# Patient Record
Sex: Male | Born: 2000 | Race: White | Hispanic: No | Marital: Single | Smoking: Current every day smoker
Health system: Southern US, Community
[De-identification: ages and names within clinical notes are randomized; demographics above are authoritative.]

---
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
## 2019-11-19 IMAGING — US US SCROTUM W/ DOPPLER COMPLETE
1 series · 14 of 25 positions shown · non-contrast
Comparison: None.

CLINICAL DATA: Right testicular pain for 1 day

EXAM:
SCROTAL ULTRASOUND
DOPPLER ULTRASOUND OF THE TESTICLES
TECHNIQUE: Complete ultrasound examination of the testicles, epididymis, and
other scrotal structures was performed. Color and spectral Doppler
ultrasound were also utilized to evaluate blood flow to the
testicles.

[Series 1: us scrotum w/ doppler complete · 14 of 51 slices shown]
[im 1/51]
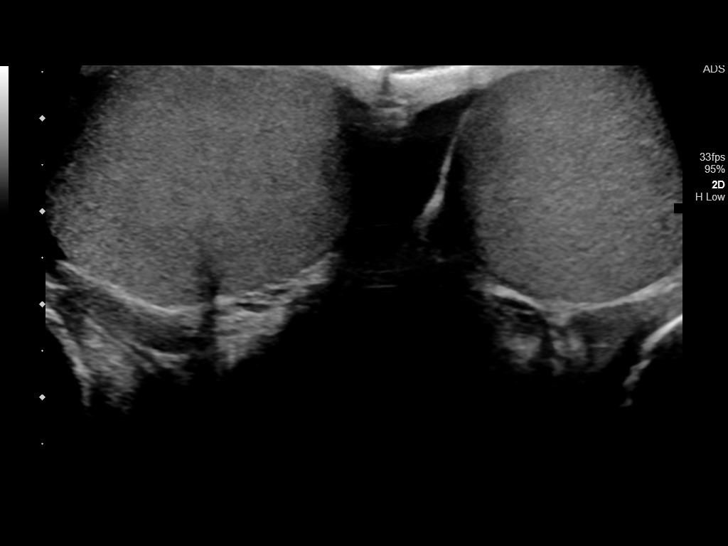
[im 5/51]
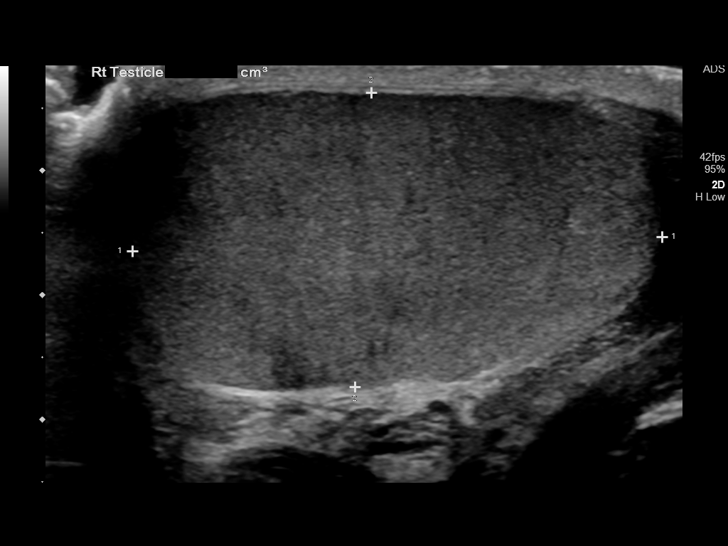
[im 9/51]
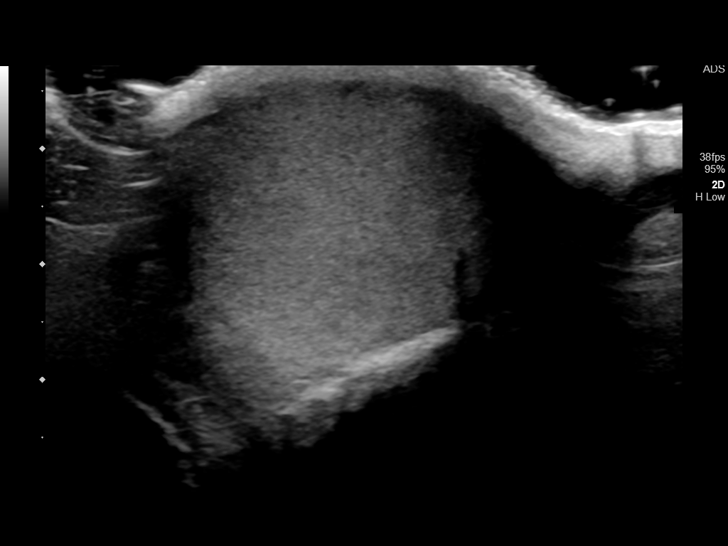
[im 13/51]
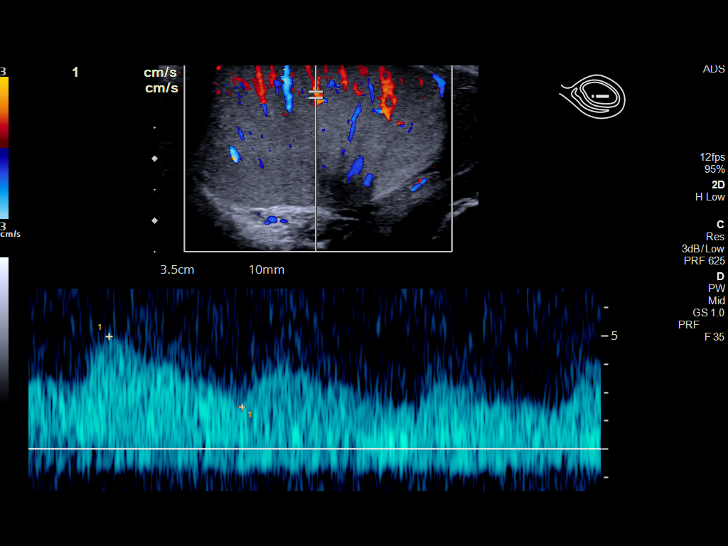
[im 17/51]
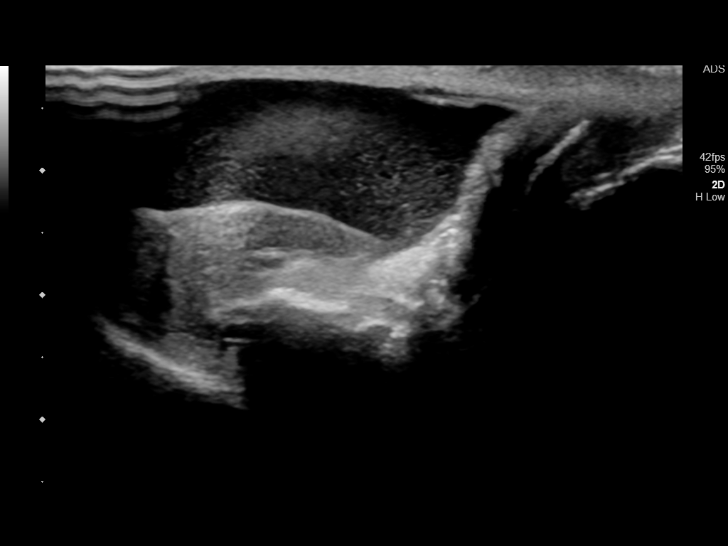
[im 19/51]
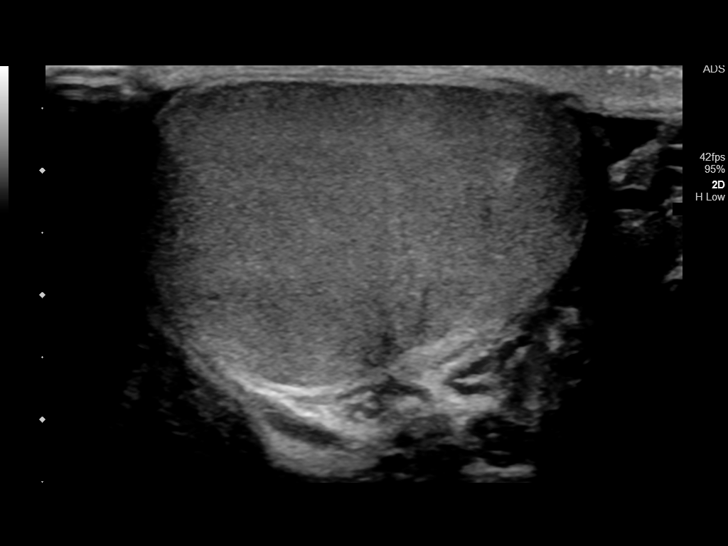
[im 23/51]
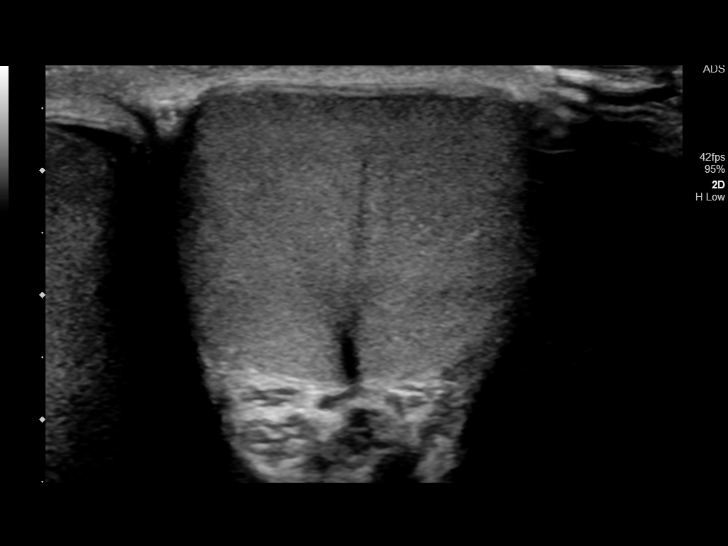
[im 28/51]
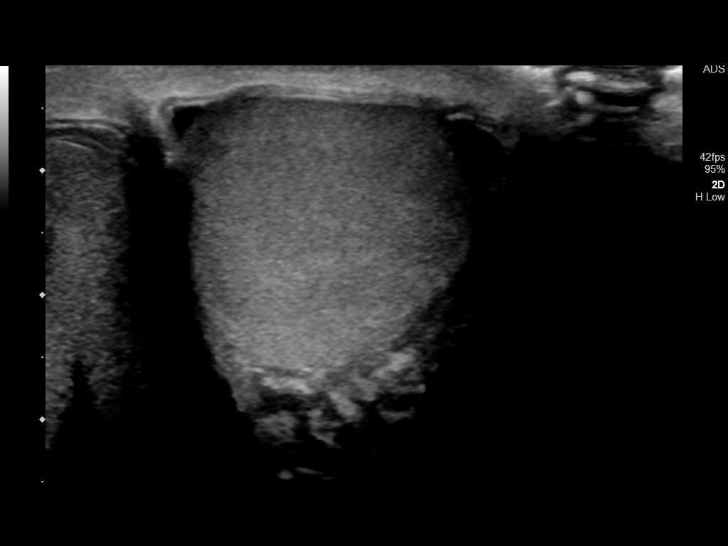
[im 32/51]
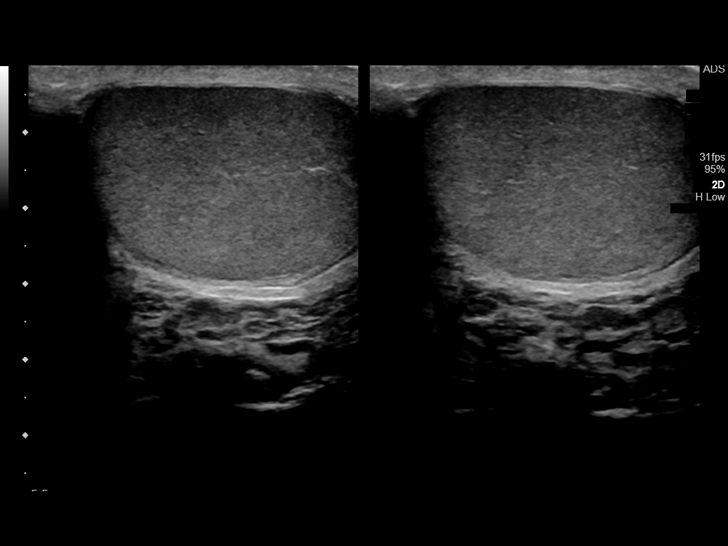
[im 34/51]
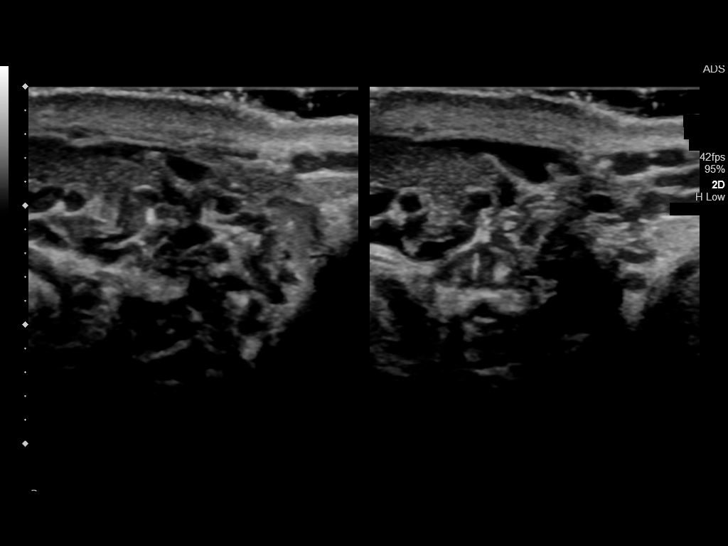
[im 38/51]
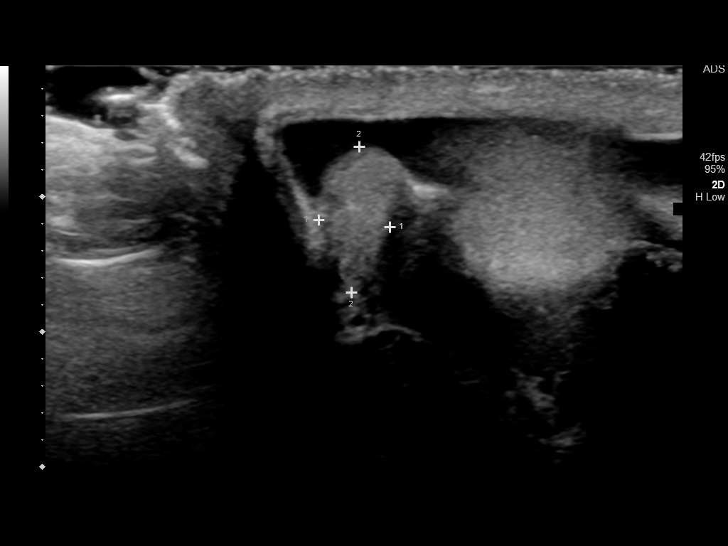
[im 42/51]
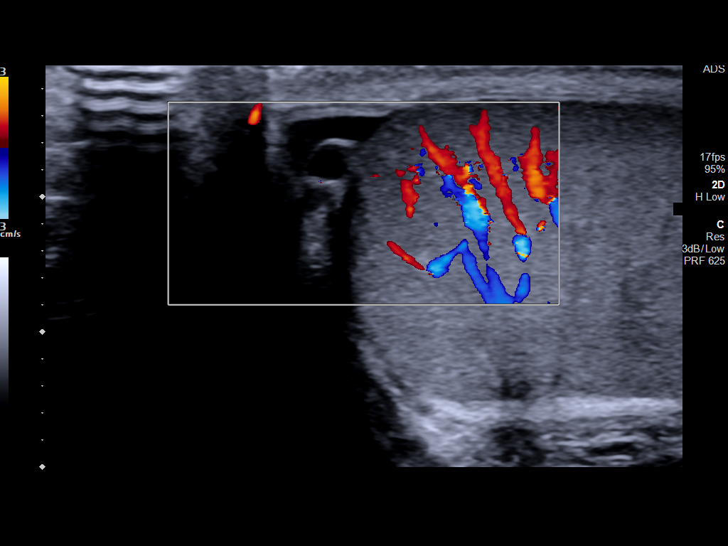
[im 46/51]
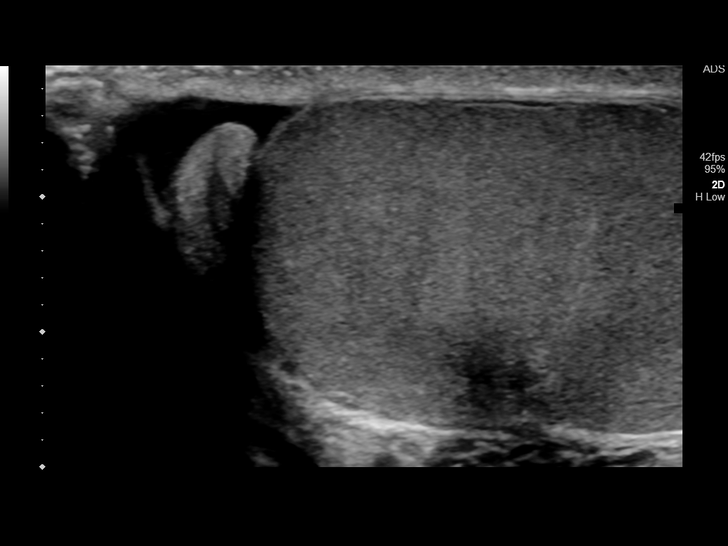
[im 51/51]
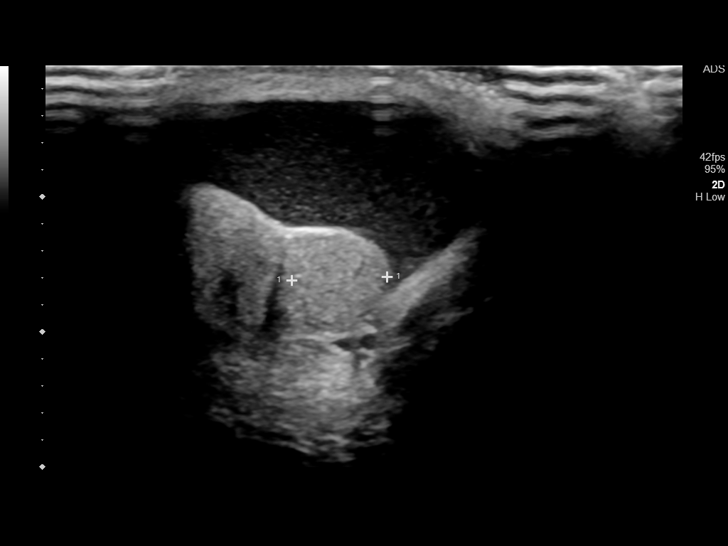

[14 of 25 positions shown; findings below may reference images not displayed]

FINDINGS: Right testicle

Measurements: 4.2 x 2.4 x 3.6 cm. No mass or microlithiasis
visualized.

Left testicle

Measurements: 4.2 x 2.5 x 2.9 cm. No mass or microlithiasis
visualized.

Right epididymis: Normal in size. 3 x 3 x 4 mm right epididymal
cyst.

Left epididymis:  Normal in size and appearance.

Hydrocele:  Trace left hydrocele.

Varicocele:  None visualized.

Pulsed Doppler interrogation of both testes demonstrates normal low
resistance arterial and venous waveforms bilaterally.
IMPRESSION: 1. No acute testicular abnormality. No testicular torsion. No
testicular mass.

## 2019-12-25 ENCOUNTER — Ambulatory Visit: Payer: Managed Care, Other (non HMO) | Attending: Internal Medicine

## 2019-12-25 DIAGNOSIS — Z23 Encounter for immunization: Secondary | ICD-10-CM

## 2019-12-25 NOTE — Progress Notes (Signed)
   Covid-19 Vaccination Clinic  Name:  Alecxander Mainwaring    MRN: [id number] DOB: 01-21-2001  12/25/2019  Mr. Bonet was observed post Covid-19 immunization for 15 minutes without incident. He was provided with Vaccine Information Sheet and instruction to access the V-Safe system.   Mr. Schweigert was instructed to call 911 with any severe reactions post vaccine: Marland Kitchen Difficulty breathing  . Swelling of face and throat  . A fast heartbeat  . A bad rash all over body  . Dizziness and weakness   Immunizations Administered    Name Date Dose VIS Date Route   Moderna COVID-19 Vaccine 12/25/2019  1:21 PM 0.5 mL 09/09/2019 Intramuscular   Manufacturer: Moderna   Lot: [id number]   NDC: 28118-867-73

## 2020-01-22 ENCOUNTER — Telehealth: Payer: Self-pay

## 2020-01-22 NOTE — Telephone Encounter (Signed)
Patient called stating that he just filled out questionnaire for his 2nd vaccine.  He was exposed to COVID-19 April 9th and has been in quarantine. He has no symptoms. Patient informed that he can have his 2nd vaccine as long as he has completed 14 days of quarantine and has no stymptoms. He states his appointment is the 4/20.  He verbalized understanding of all information.

## 2020-01-27 ENCOUNTER — Ambulatory Visit: Payer: Managed Care, Other (non HMO) | Attending: Family

## 2020-01-27 DIAGNOSIS — Z23 Encounter for immunization: Secondary | ICD-10-CM

## 2020-01-27 NOTE — Progress Notes (Signed)
   Covid-19 Vaccination Clinic  Name:  Luis Hall    MRN: [id number] DOB: 2001-03-02  01/27/2020  Mr. [name] was observed post Covid-19 immunization for 15 minutes without incident. He was provided with Vaccine Information Sheet and instruction to access the V-Safe system.   Mr. [name] was instructed to call 911 with any severe reactions post vaccine: Marland Kitchen Difficulty breathing  . Swelling of face and throat  . A fast heartbeat  . A bad rash all over body  . Dizziness and weakness   Immunizations Administered    Name Date Dose VIS Date Route   Moderna COVID-19 Vaccine 01/27/2020 11:43 AM 0.5 mL 09/2019 Intramuscular   Manufacturer: Moderna   Lot: [id number]   NDC: 11464-314-27

## 2020-07-18 ENCOUNTER — Emergency Department
Admission: EM | Admit: 2020-07-18 | Discharge: 2020-07-18 | Disposition: A | Discharge status: Home / Self Care | Payer: Managed Care, Other (non HMO) | Attending: Emergency Medicine | Admitting: Emergency Medicine

## 2020-07-18 ENCOUNTER — Encounter: Payer: Self-pay | Admitting: Emergency Medicine

## 2020-07-18 ENCOUNTER — Other Ambulatory Visit: Payer: Self-pay

## 2020-07-18 DIAGNOSIS — F17200 Nicotine dependence, unspecified, uncomplicated: Secondary | ICD-10-CM | POA: Diagnosis not present

## 2020-07-18 DIAGNOSIS — S0101XA Laceration without foreign body of scalp, initial encounter: Secondary | ICD-10-CM | POA: Insufficient documentation

## 2020-07-18 DIAGNOSIS — W01198A Fall on same level from slipping, tripping and stumbling with subsequent striking against other object, initial encounter: Secondary | ICD-10-CM | POA: Diagnosis not present

## 2020-07-18 NOTE — ED Triage Notes (Signed)
Pt arrives ambulatory to triage with laceration on scalp due to a fall about 10 hours ago. Pt has bleeding under control without gauze needed. Pt denies LOC or any other injury and is in NAD.

## 2020-07-18 NOTE — ED Notes (Signed)
No peripheral IV placed this visit.  Discharge instructions reviewed with patient. Questions fielded by this RN. Patient verbalizes understanding of instructions. Patient discharged home in stable condition per Dr Luis Hall . No acute distress noted at time of discharge.   Pt ambu to DC

## 2020-07-18 NOTE — ED Provider Notes (Signed)
[facility] Emergency Department Provider Note ____________________________________________   First MD Initiated Contact with Patient 07/18/20 [phone]     (approximate)  I have reviewed the triage vital signs and the nursing notes.  HISTORY  Chief Complaint Laceration   HPI Luis Hall is a 19 y.o. Luis Hall presents to the ED for evaluation of scalp laceration.  Chart review indicates no relevant medical history.  Patient denies medical history, denies recent illnesses and takes no blood thinners.  Patient reports walking down a set of steps about 12 hours ago when he accidentally slipped on some water, causing him to fall backwards and strike his occiput on the ground.  Patient denies syncope, vomiting around the event or since the event.  He reports his pain is well controlled and he denies additional complaints.  He has been ambulatory since the event.  Tolerating p.o. intake.  Patient has not taken any medications prior to arrival, reporting 1/10 intensity headache that is constant, aching and nonradiating.  He reports cleaning the wound at home with peroxide and water.  History reviewed. No pertinent past medical history.  There are no problems to display for this patient.   History reviewed. No pertinent surgical history.  Prior to Admission medications   Not on File    Allergies Peanut oil and Tree extract  No family history on file.  Social History Social History   Tobacco Use  . Smoking status: Current Every Day Smoker  . Smokeless tobacco: Never Used  Substance Use Topics  . Alcohol use: Yes  . Drug use: Not Currently    Review of Systems  Constitutional: No fever/chills.  Positive for fall and scalp laceration. Eyes: No visual changes. ENT: No sore throat. Cardiovascular: Denies chest pain. Respiratory: Denies shortness of breath. Gastrointestinal: No abdominal pain.  No nausea, no vomiting.  No diarrhea.  No  constipation. Genitourinary: Negative for dysuria. Musculoskeletal: Negative for back pain. Skin: Negative for rash. Neurological: Negative for headaches, focal weakness or numbness.  ____________________________________________   PHYSICAL EXAM:  VITAL SIGNS: Vitals:   07/18/20 0107  BP: 138/88  Pulse: (!) 105  Resp: (!) 24  Temp: 98.4 F (36.9 C)  SpO2: 95%      Constitutional: Alert and oriented. Well appearing and in no acute distress. Eyes: Conjunctivae are normal. PERRL. EOMI. Head: Left parietal/occipital scalp shows a superficial semilunate laceration into the dermis, about 3 cm in length, hemostatic and appears clean. Nose: No congestion/rhinnorhea. Mouth/Throat: Mucous membranes are moist.  Oropharynx non-erythematous. Neck: No stridor. No cervical spine tenderness to palpation. Cardiovascular: Normal rate, regular rhythm. Grossly normal heart sounds.  Good peripheral circulation. Respiratory: Normal respiratory effort.  No retractions. Lungs CTAB. Gastrointestinal: Soft , nondistended, nontender to palpation. No abdominal bruits. No CVA tenderness. Musculoskeletal: No lower extremity tenderness nor edema.  No joint effusions. No signs of acute trauma. Neurologic:  Normal speech and language. No gross focal neurologic deficits are appreciated. No gait instability noted. Skin:  Skin is warm, dry and intact. No rash noted. Psychiatric: Mood and affect are normal. Speech and behavior are normal.  ____________________________________________   MDM / ED COURSE  Otherwise healthy 19 year old male presents to the ED for evaluation after accidental mechanical fall with a scalp laceration not requiring repair and amenable to outpatient management.  Normal vital signs on room air.  Exam is reassuring with a superficial laceration into the dermis that is hemostatic and already cleaned.  No gaping wound margins or need for primary repair.  Patient is [place] CT head negative  and no indications for advanced imaging of his brain.  Patient denies syncope and give a good history of accidental mechanical fall on a rainy day when slipping on wet steps.  We discussed outpatient management of his wound, we discussed return precautions for the ED.  Patient medically stable for discharge home.      ____________________________________________   FINAL CLINICAL IMPRESSION(S) / ED DIAGNOSES  Final diagnoses:  Scalp laceration, initial encounter     ED Discharge Orders    None       [name]   Note:  This document was prepared using Dragon voice recognition software and may include unintentional dictation errors.   Delton Prairie, MD 07/18/20 [phone]

## 2020-07-18 NOTE — Discharge Instructions (Addendum)
The laceration on yourr scalp is very superficial and does not require staples or stitches.  As we discussed, please keep the area clean and dry for the next few days to allow to heal appropriately. It is okay to shower and to gently wash the area with warm soap and water, do not vigorously scrub at it.  Do not swim or submerge your head in water until this heals.  Please take Tylenol and ibuprofen/Advil for your pain.  It is safe to take them together, or to alternate them every few hours.  Take up to 1000mg  of Tylenol at a time, up to 4 times per day.  Do not take more than 4000 mg of Tylenol in 24 hours.  For ibuprofen, take 400-600 mg, 4-5 times per day.

## 2020-07-18 NOTE — ED Notes (Signed)
Pt reports falling on steps approx 5pm on 10.9, pt denies LOC and minimal bleeding, pt reports showering and cleaning the area, wound isn't open
